# Patient Record
Sex: Female | Born: 1980 | Race: White | Hispanic: No | Marital: Single | Smoking: Never smoker
Health system: Southern US, Community
[De-identification: ages and names within clinical notes are randomized; demographics above are authoritative.]

---

## 2007-12-22 ENCOUNTER — Emergency Department (HOSPITAL_COMMUNITY): Admission: EM | Admit: 2007-12-22 | Discharge: 2007-12-22 | Payer: Self-pay | Admitting: Emergency Medicine

## 2008-10-09 ENCOUNTER — Other Ambulatory Visit: Admission: RE | Admit: 2008-10-09 | Discharge: 2008-10-09 | Payer: Self-pay | Admitting: Family Medicine

## 2011-06-13 ENCOUNTER — Other Ambulatory Visit (HOSPITAL_COMMUNITY)
Admission: RE | Admit: 2011-06-13 | Discharge: 2011-06-13 | Disposition: A | Discharge status: Home / Self Care | Payer: BC Managed Care – PPO | Source: Ambulatory Visit | Attending: Family Medicine | Admitting: Family Medicine

## 2011-06-13 DIAGNOSIS — Z01419 Encounter for gynecological examination (general) (routine) without abnormal findings: Secondary | ICD-10-CM | POA: Insufficient documentation

## 2011-09-22 ENCOUNTER — Emergency Department (HOSPITAL_COMMUNITY)
Admission: EM | Admit: 2011-09-22 | Discharge: 2011-09-23 | Disposition: A | Discharge status: Home / Self Care | Payer: PRIVATE HEALTH INSURANCE | Attending: Emergency Medicine | Admitting: Emergency Medicine

## 2011-09-22 ENCOUNTER — Encounter (HOSPITAL_COMMUNITY): Payer: Self-pay | Admitting: Emergency Medicine

## 2011-09-22 DIAGNOSIS — H53149 Visual discomfort, unspecified: Secondary | ICD-10-CM | POA: Insufficient documentation

## 2011-09-22 DIAGNOSIS — Y9210 Unspecified residential institution as the place of occurrence of the external cause: Secondary | ICD-10-CM | POA: Insufficient documentation

## 2011-09-22 DIAGNOSIS — S0003XA Contusion of scalp, initial encounter: Secondary | ICD-10-CM | POA: Insufficient documentation

## 2011-09-22 DIAGNOSIS — S0990XA Unspecified injury of head, initial encounter: Secondary | ICD-10-CM

## 2011-09-22 DIAGNOSIS — S0083XA Contusion of other part of head, initial encounter: Secondary | ICD-10-CM | POA: Insufficient documentation

## 2011-09-22 DIAGNOSIS — W2209XA Striking against other stationary object, initial encounter: Secondary | ICD-10-CM | POA: Insufficient documentation

## 2011-09-22 NOTE — ED Notes (Signed)
PT. 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 NO BLEEDING NOTED.

## 2011-09-23 MED ORDER — IBUPROFEN 600 MG PO TABS: 600.0000 mg | ORAL_TABLET | Freq: Three times a day (TID) | ORAL | Status: AC | PRN

## 2011-09-23 MED ORDER — ONDANSETRON 4 MG PO TBDP
8.0000 mg | ORAL_TABLET | Freq: Once | ORAL | Status: AC
  Administered 2011-09-23: 8 mg via ORAL
  Filled 2011-09-23: qty 2

## 2011-09-23 MED ORDER — ONDANSETRON 8 MG PO TBDP: 8.0000 mg | ORAL_TABLET | Freq: Three times a day (TID) | ORAL | Status: AC | PRN

## 2011-09-23 MED ORDER — IBUPROFEN 200 MG PO TABS
600.0000 mg | ORAL_TABLET | Freq: Once | ORAL | Status: AC
  Administered 2011-09-23: 600 mg via ORAL
  Filled 2011-09-23: qty 3

## 2011-09-23 NOTE — ED Notes (Signed)
Rx given x2 D/c instructions reviewed w/ pt - pt denies any further questions or concerns at present.   

## 2011-09-23 NOTE — ED Provider Notes (Signed)
History     CSN: [id number]  Arrival date & time 09/22/11  4098   First MD Initiated Contact with Patient 09/23/11 0019      Chief Complaint  Patient presents with  . 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 She had no loss of consciousness, but had tenderness to the scalp at the vertex of the skull were contusion is seen, and has had a dull generalized moderate intensity headache subsequently with occasional nausea and lightheadedness but no dizziness, vertigo, or vomiting. She denies any neck or back pain or focal numbness or weakness. She is awake, alert, and oriented appropriately on evaluation in no significant distress.  Patient is a 31 y.o. female presenting with head injury. The history is provided by the patient.  Head Injury  The incident occurred 6 to 12 hours ago. She came to the ER via walk-in. The injury mechanism was a direct blow. There was no loss of consciousness. There was no blood loss. The quality of the pain is described as dull. The pain is moderate. The pain has been constant (Gradually improving) since the injury. Pertinent negatives include no numbness, no blurred vision, no vomiting, no tinnitus, no disorientation, no weakness and no memory loss. She has tried nothing for the symptoms.    History reviewed. No pertinent past medical history.  History reviewed. No pertinent past surgical history.  No family history on file.  History  Substance Use Topics  . Smoking status: Never Smoker   . Smokeless tobacco: Not on file  . Alcohol Use: Yes    OB History    Grav Para Term Preterm Abortions TAB SAB Ect Mult Living                  Review of Systems  Constitutional: Negative for fever, chills, diaphoresis and appetite change.  HENT: Negative for hearing loss, ear pain, congestion, sore throat, facial swelling, rhinorrhea, trouble swallowing, neck pain, neck stiffness, dental problem, sinus pressure and tinnitus.   Eyes: Positive for photophobia. Negative for blurred vision, pain, discharge, redness and visual disturbance.  Respiratory: Negative.   Cardiovascular: Negative.   Gastrointestinal: Positive for nausea. Negative for vomiting.  Genitourinary: Negative.   Musculoskeletal: Negative for back pain.  Skin: Negative.   Neurological: Positive for light-headedness and headaches. Negative for dizziness, seizures, syncope, weakness and numbness.  Hematological: Does not bruise/bleed easily.  Psychiatric/Behavioral: Negative for memory loss and confusion.    Allergies  Sudafed  Home Medications   Current Outpatient Rx  Name Route Sig Dispense Refill  . ASPIRIN-ACETAMINOPHEN-CAFFEINE 250-250-65 MG PO TABS Oral Take 1 tablet by mouth every 6 (six) hours as needed. For migraines    . DROSPIRENONE-ETHINYL ESTRADIOL 3-0.02 MG PO TABS Oral Take 1 tablet by mouth daily.    . SUMATRIPTAN SUCCINATE 100 MG PO TABS Oral Take 100 mg by mouth every 2 (two) hours as needed. For migraines      BP 148/91  Pulse 80  Temp(Src) 97.7 F (36.5 C) (Oral)  Resp 18  SpO2 98%  Physical Exam  Constitutional: She is oriented to person, place, and time. She appears well-developed and well-nourished. No distress.  HENT:  Head: Normocephalic. Head is  with abrasion and with contusion. Head is without raccoon's eyes, without Battle's sign and without laceration.    Right Ear: Hearing, tympanic membrane, external ear and ear canal normal.  Left Ear: Hearing, tympanic membrane, external ear and ear canal normal.  Nose: Nose normal. No rhinorrhea.  Mouth/Throat: Uvula is midline, oropharynx is clear and moist and  mucous membranes are normal. No oropharyngeal exudate.  Eyes: Conjunctivae and EOM are normal. Pupils are equal, round, and reactive to light. Right eye exhibits no nystagmus. Left eye exhibits no nystagmus.  Fundoscopic exam:      The right eye shows no papilledema.       The left eye shows no papilledema.  Neck: Normal range of motion, full passive range of motion without pain and phonation normal. Neck supple. Carotid bruit is not present.  Cardiovascular: Normal rate, regular rhythm, normal heart sounds and intact distal pulses.  Exam reveals no gallop and no friction rub.   No murmur heard. Pulmonary/Chest: Effort normal and breath sounds normal. No respiratory distress. She has no wheezes. She has no rales.  Abdominal: Soft. Bowel sounds are normal. She exhibits no distension. There is no tenderness. There is no rebound and no guarding.  Musculoskeletal: Normal range of motion. She exhibits no edema and no tenderness.  Neurological: She is alert and oriented to person, place, and time. She has normal reflexes. She displays no tremor. No cranial nerve deficit or sensory deficit. She exhibits normal muscle tone. She displays a negative Romberg sign. Coordination and gait normal. GCS eye subscore is 4. GCS verbal subscore is 5. GCS motor subscore is 6.  Skin: Skin is warm and dry. No rash noted. She is not diaphoretic.  Psychiatric: She has a normal mood and affect. Her behavior is normal. Judgment and thought content normal.    ED Course  Procedures (including critical care time)  Labs Reviewed - No data to display No results found.   No diagnosis found.    MDM  The patient appears to have had a minor head injury with contusion and abrasion to the vertex of the scalp without laceration or suggestion of skull fracture. The patient's history and physical exam do not suggest a serious intracranial injury, and I suspect she has had a mild concussion with minor head injury. No further  tests are warranted. I will treat the patient symptomatically with NSAIDs and anti-emetics and discharge her home. The patient states her understanding of and agreement with the plan of care.        Felisa Bonier, MD 09/23/11 [phone]

## 2011-09-23 NOTE — Discharge Instructions (Signed)
Head Injury, Adult You have had a head injury that does not appear serious at this time. A concussion is a state of changed mental ability, usually from a blow to the head. You should take clear liquids for the rest of the day and then resume your regular diet. You should not take sedatives or alcoholic beverages for as long as directed by your caregiver after discharge. After injuries such as yours, most problems occur within the first 24 hours. SYMPTOMS These minor symptoms may be experienced after discharge:  Memory difficulties.   Dizziness.   Headaches.   Double vision.   Hearing difficulties.   Depression.   Tiredness.   Weakness.   Difficulty with concentration.  If you experience any of these problems, you should not be alarmed. A concussion requires a few days for recovery. Many patients with head injuries frequently experience such symptoms. Usually, these problems disappear without medical care. If symptoms last for more than one day, notify your caregiver. See your caregiver sooner if symptoms are becoming worse rather than better. HOME CARE INSTRUCTIONS   During the next 24 hours you must stay with someone who can watch you for the warning signs listed below.  Although it is unlikely that serious side effects will occur, you should be aware of signs and symptoms which may necessitate your return to this location. Side effects may occur up to 7 - 10 days following the injury. It is important for you to carefully monitor your condition and contact your caregiver or seek immediate medical attention if there is a change in your condition. SEEK IMMEDIATE MEDICAL CARE IF:   There is confusion or drowsiness.   You can not awaken the injured person.   There is nausea (feeling sick to your stomach) or continued, forceful vomiting.   You notice dizziness or unsteadiness which is getting worse, or inability to walk.   You have convulsions or unconsciousness.   You experience  severe, persistent headaches not relieved by over-the-counter or prescription medicines for pain. (Do not take aspirin as this impairs clotting abilities). Take other pain medications only as directed.   You can not use arms or legs normally.   There is clear or bloody discharge from the nose or ears.  MAKE SURE YOU:   Understand these instructions.   Will watch your condition.   Will get help right away if you are not doing well or get worse.  Document Released: 06/20/2005 Document Revised: 06/09/2011 Document Reviewed: 05/08/2009 [facility] Patient Information 2012 [place], [place].Cryotherapy Cryotherapy means treatment with cold. Ice or gel packs can be used to reduce both pain and swelling. Ice is the most helpful within the first 24 to 48 hours after an injury or flareup from overusing a muscle or joint. Sprains, strains, spasms, burning pain, shooting pain, and aches can all be eased with ice. Ice can also be used when recovering from surgery. Ice is effective, has very few side effects, and is safe for most people to use. PRECAUTIONS  Ice is not a safe treatment option for people with:  Raynaud's phenomenon. This is a condition affecting small blood vessels in the extremities. Exposure to cold may cause your problems to return.   Cold hypersensitivity. There are many forms of cold hypersensitivity, including:   Cold urticaria. Red, itchy hives appear on the skin when the tissues begin to warm after being iced.   Cold erythema. This is a red, itchy rash caused by exposure to cold.   Cold hemoglobinuria.  Red blood cells break down when the tissues begin to warm after being iced. The hemoglobin that carry oxygen are passed into the urine because they cannot combine with blood proteins fast enough.   Numbness or altered sensitivity in the area being iced.  If you have any of the following conditions, do not use ice until you have discussed cryotherapy with your caregiver:  Heart  conditions, such as arrhythmia, angina, or chronic heart disease.   High blood pressure.   Healing wounds or open skin in the area being iced.   Current infections.   Rheumatoid arthritis.   Poor circulation.   Diabetes.  Ice slows the blood flow in the region it is applied. This is beneficial when trying to stop inflamed tissues from spreading irritating chemicals to surrounding tissues. However, if you expose your skin to cold temperatures for too long or without the proper protection, you can damage your skin or nerves. Watch for signs of skin damage due to cold. HOME CARE INSTRUCTIONS Follow these tips to use ice and cold packs safely.  Place a dry or damp towel between the ice and skin. A damp towel will cool the skin more quickly, so you may need to shorten the time that the ice is used.   For a more rapid response, add gentle compression to the ice.   Ice for no more than 10 to 20 minutes at a time. The bonier the area you are icing, the less time it will take to get the benefits of ice.   Check your skin after 5 minutes to make sure there are no signs of a poor response to cold or skin damage.   Rest 20 minutes or more in between uses.   Once your skin is numb, you can end your treatment. You can test numbness by very lightly touching your skin. The touch should be so light that you do not see the skin dimple from the pressure of your fingertip. When using ice, most people will feel these normal sensations in this order: cold, burning, aching, and numbness.   Do not use ice on someone who cannot communicate their responses to pain, such as small children or people with dementia.  HOW TO MAKE AN ICE PACK Ice packs are the most common way to use ice therapy. Other methods include ice massage, ice baths, and cryo-sprays. Muscle creams that cause a cold, tingly feeling do not offer the same benefits that ice offers and should not be used as a substitute unless recommended by your  caregiver. To make an ice pack, do one of the following:  Place crushed ice or a bag of frozen vegetables in a sealable plastic bag. Squeeze out the excess air. Place this bag inside another plastic bag. Slide the bag into a pillowcase or place a damp towel between your skin and the bag.   Mix 3 parts water with 1 part rubbing alcohol. Freeze the mixture in a sealable plastic bag. When you remove the mixture from the freezer, it will be slushy. Squeeze out the excess air. Place this bag inside another plastic bag. Slide the bag into a pillowcase or place a damp towel between your skin and the bag.  SEEK MEDICAL CARE IF:  You develop white spots on your skin. This may give the skin a blotchy (mottled) appearance.   Your skin turns blue or pale.   Your skin becomes waxy or hard.   Your swelling gets worse.  MAKE SURE YOU:  Understand these instructions.   Will watch your condition.   Will get help right away if you are not doing well or get worse.  Document Released: 02/14/2011 Document Revised: 06/09/2011 Document Reviewed: 02/14/2011 [facility] Patient Information 2012 [place], [place].Facial and Scalp Contusions You have a contusion (bruise) on your face or scalp. Injuries around the face and head generally cause a lot of swelling, especially around the eyes. This is because the blood supply to this area is good and tissues are loose. Swelling from a contusion is usually better in 2-3 days. It may take a week or longer for a "black eye" to clear up completely. HOME CARE INSTRUCTIONS   Apply ice packs to the injured area for about 15 to 20 minutes, 3 to 4 times a day, for the first couple days. This helps keep swelling down.   Use mild pain medicine as needed or instructed by your caregiver.   You may have a mild headache, slight dizziness, nausea, and weakness for a few days. This usually clears up with bed rest and mild pain medications.   Contact your caregiver if you are concerned  about facial defects or have any difficulty with your bite or develop pain with chewing.  SEEK IMMEDIATE MEDICAL CARE IF:  You develop severe pain or a headache, unrelieved by medication.   You develop unusual sleepiness, confusion, personality changes, or vomiting.   You have a persistent nosebleed, double or blurred vision, or drainage from the nose or ear.   You have difficulty walking or using your arms or legs.  MAKE SURE YOU:   Understand these instructions.   Will watch your condition.   Will get help right away if you are not doing well or get worse.  Document Released: 07/28/2004 Document Revised: 06/09/2011 Document Reviewed: 05/06/2011 [facility] Patient Information 2012 [place], [place].Scalp Hematoma A bruise of the scalp causes swelling from an accumulation of blood in the area of the injury. This is a common problem. This problem is also called a scalp hematoma. This normally is not serious. Usually a scalp hematoma causes only mild local pain or headache. It usually disappears after 2 to 3 days with proper treatment.  You should apply ice packs to the swollen area for 20 to 30 minutes every 2 to 3 hours until the swelling improves. Only take over-the-counter or prescription medicines for pain and discomfort as directed by your caregiver. It is best to avoid aspirin because this may increase bleeding. You may have a mild headache, slight dizziness, nausea, or weakness for the next few days. This usually clears up with rest.  SEEK IMMEDIATE MEDICAL CARE IF:  You develop severe pain or headache, unrelieved by medicine.   You develop unusual sleepiness, confusion, personality changes, or difficulty with coordination or walking.   You develop a persistent nose bleed, double or blurred vision, or unusual drainage from the nose or ear.   You develop numbness or weakness in the limbs.  Document Released: 07/28/2004 Document Revised: 06/09/2011 Document Reviewed:  05/05/2009 [facility] Patient Information 2012 [place], [place].

## 2011-09-23 NOTE — ED Notes (Signed)
Pt hit her head on a on a metal object tonight while working on [employer]00.  Denies loc, nausea, but states dizziness on ambulation and photophobia.  Needs paperwork filled out by MD.

## 2012-04-09 ENCOUNTER — Other Ambulatory Visit: Payer: Self-pay | Admitting: Family Medicine

## 2012-04-09 ENCOUNTER — Ambulatory Visit
Admission: RE | Admit: 2012-04-09 | Discharge: 2012-04-09 | Disposition: A | Discharge status: Home / Self Care | Payer: Worker's Compensation | Source: Ambulatory Visit | Attending: Family Medicine | Admitting: Family Medicine

## 2012-04-09 DIAGNOSIS — W19XXXA Unspecified fall, initial encounter: Secondary | ICD-10-CM

## 2012-07-10 ENCOUNTER — Other Ambulatory Visit (HOSPITAL_COMMUNITY)
Admission: RE | Admit: 2012-07-10 | Discharge: 2012-07-10 | Disposition: A | Discharge status: Home / Self Care | Payer: BC Managed Care – PPO | Source: Ambulatory Visit | Attending: Family Medicine | Admitting: Family Medicine

## 2012-07-10 DIAGNOSIS — Z01419 Encounter for gynecological examination (general) (routine) without abnormal findings: Secondary | ICD-10-CM | POA: Insufficient documentation

## 2012-07-11 ENCOUNTER — Other Ambulatory Visit: Payer: Self-pay | Admitting: Physician Assistant

## 2013-09-05 ENCOUNTER — Other Ambulatory Visit: Payer: Self-pay | Admitting: Physician Assistant

## 2013-09-05 ENCOUNTER — Other Ambulatory Visit (HOSPITAL_COMMUNITY)
Admission: RE | Admit: 2013-09-05 | Discharge: 2013-09-05 | Disposition: A | Discharge status: Home / Self Care | Payer: No Typology Code available for payment source | Source: Ambulatory Visit | Attending: Family Medicine | Admitting: Family Medicine

## 2013-09-05 DIAGNOSIS — R1013 Epigastric pain: Secondary | ICD-10-CM

## 2013-09-05 DIAGNOSIS — Z01419 Encounter for gynecological examination (general) (routine) without abnormal findings: Secondary | ICD-10-CM | POA: Insufficient documentation

## 2013-09-13 ENCOUNTER — Ambulatory Visit
Admission: RE | Admit: 2013-09-13 | Discharge: 2013-09-13 | Disposition: A | Discharge status: Home / Self Care | Payer: No Typology Code available for payment source | Source: Ambulatory Visit | Attending: Physician Assistant | Admitting: Physician Assistant

## 2013-09-13 DIAGNOSIS — R1013 Epigastric pain: Secondary | ICD-10-CM

## 2015-01-06 IMAGING — US US ABDOMEN LIMITED
1 series · 14 of 25 positions shown · non-contrast
Comparison: None.

CLINICAL DATA: Epigastric abdominal pain, nausea, vomiting

EXAM:
US ABDOMEN LIMITED - RIGHT UPPER QUADRANT

[Series 1: us abdomen limited · 0.30mm/px · 14 of 49 slices shown]
[im 1/49]
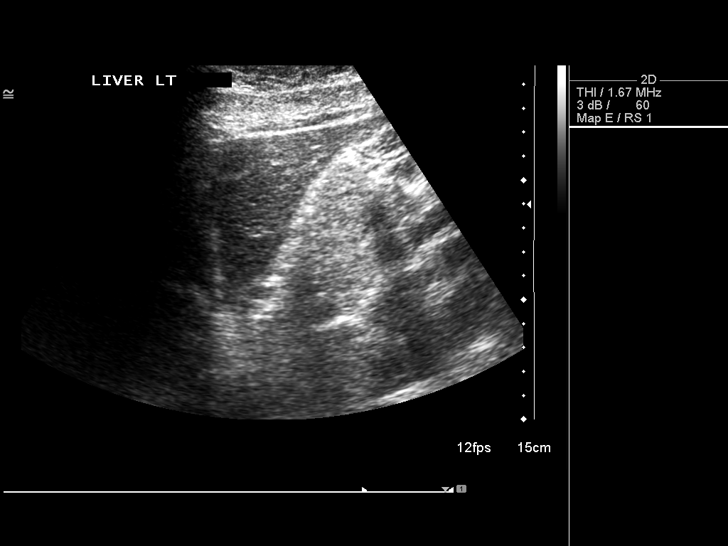
[im 5/49]
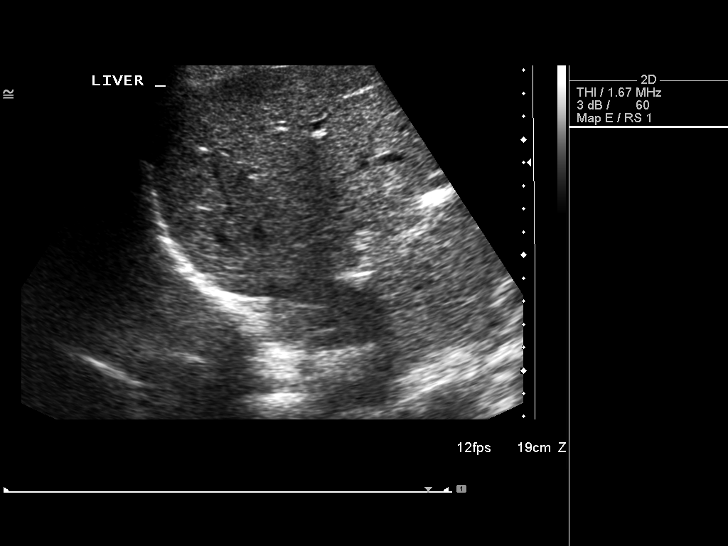
[im 9/49]
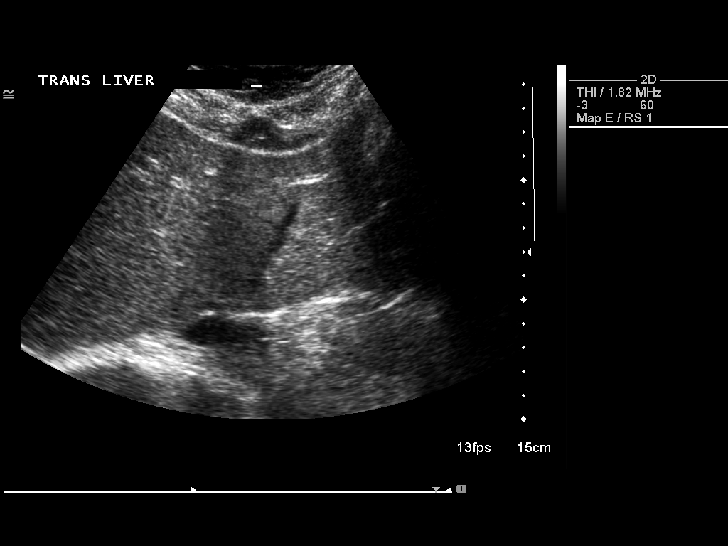
[im 13/49]
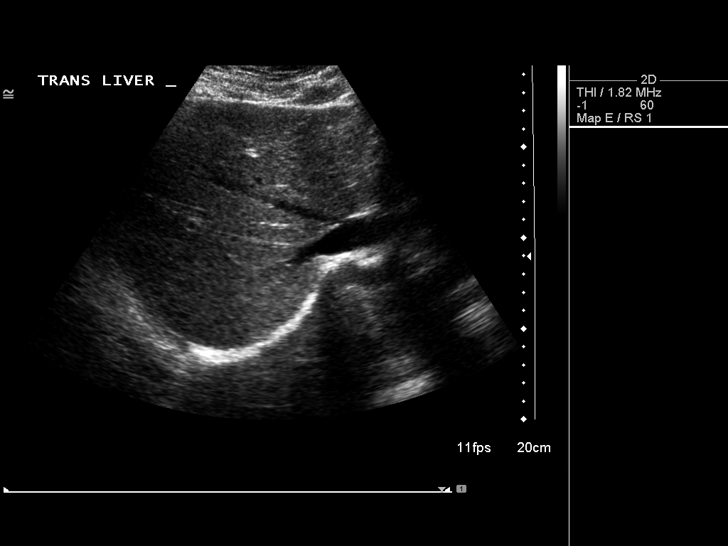
[im 17/49]
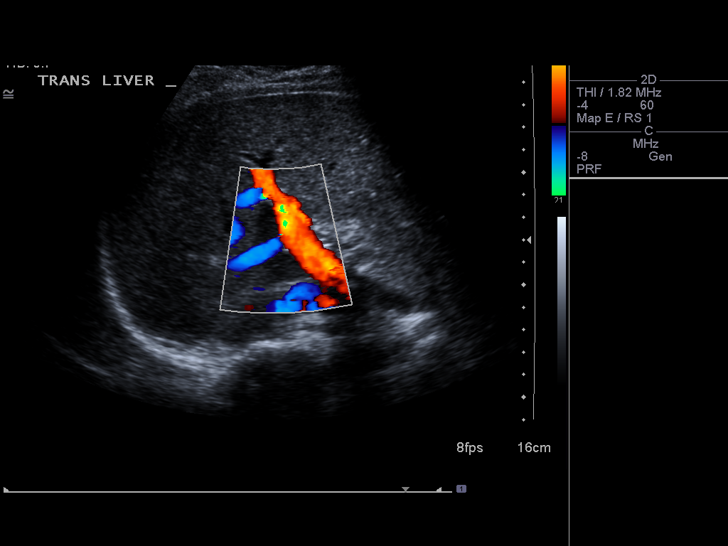
[im 19/49]
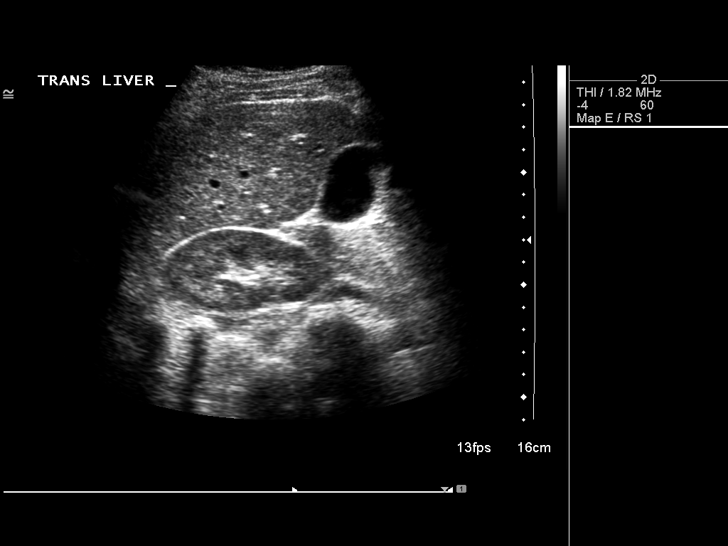
[im 23/49]
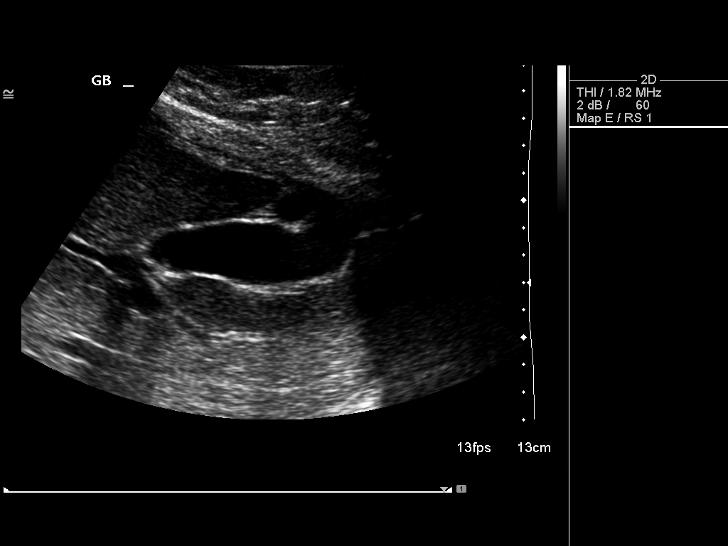
[im 27/49]
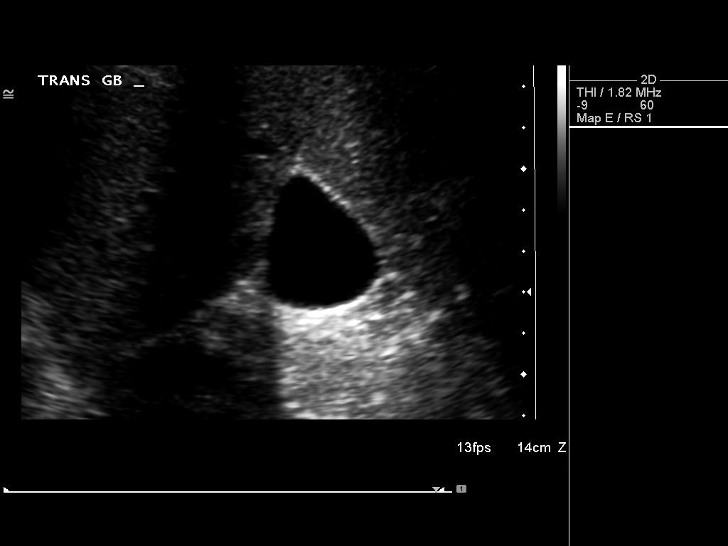
[im 31/49]
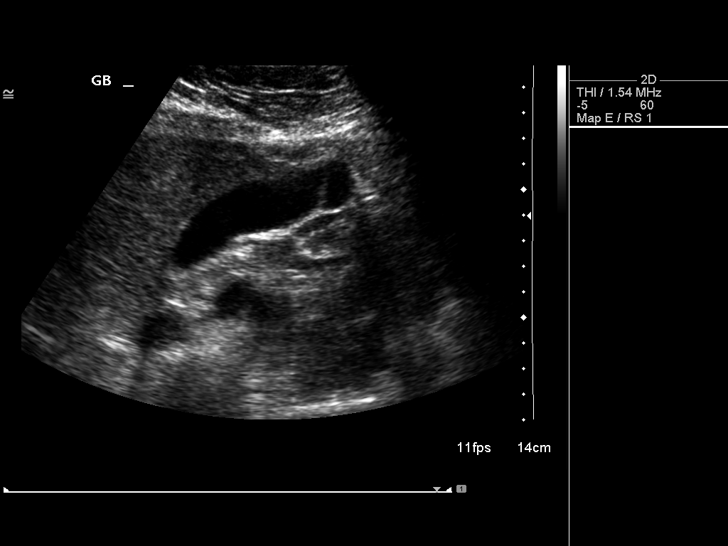
[im 33/49]
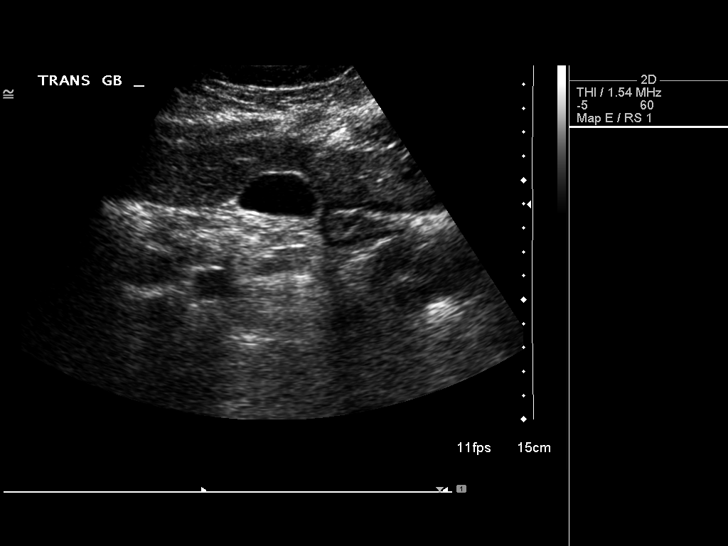
[im 37/49]
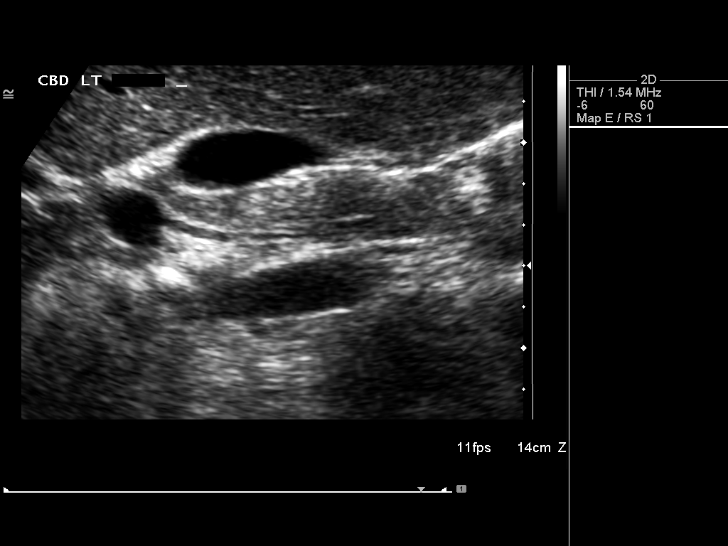
[im 41/49]
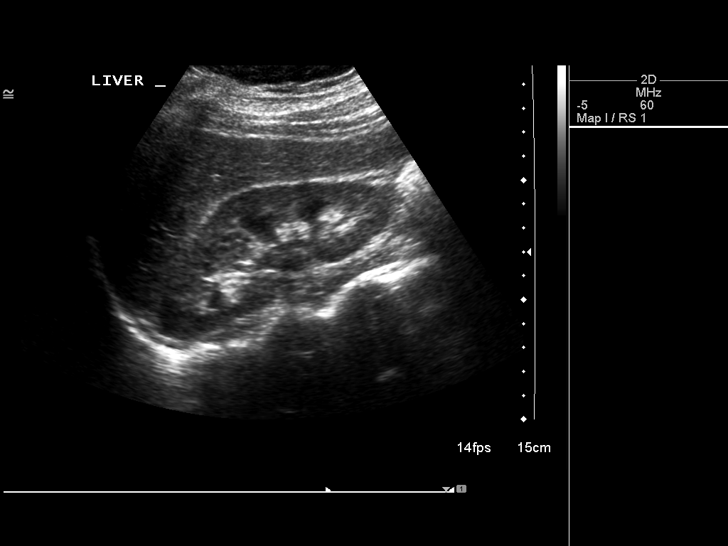
[im 45/49]
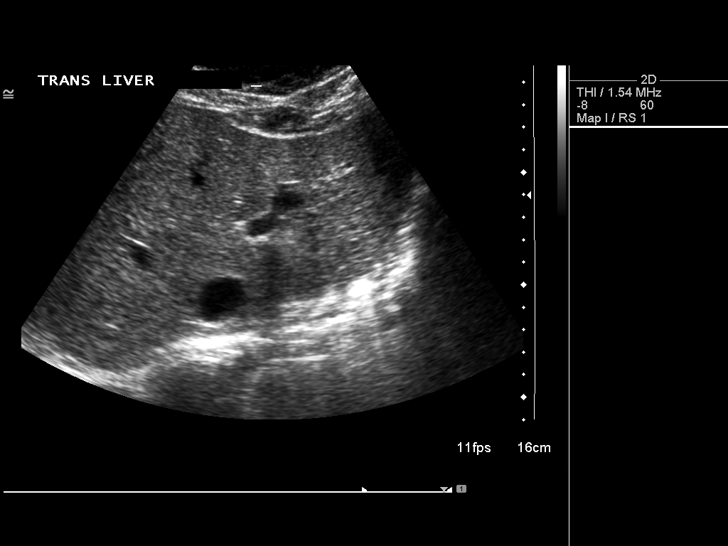
[im 49/49]
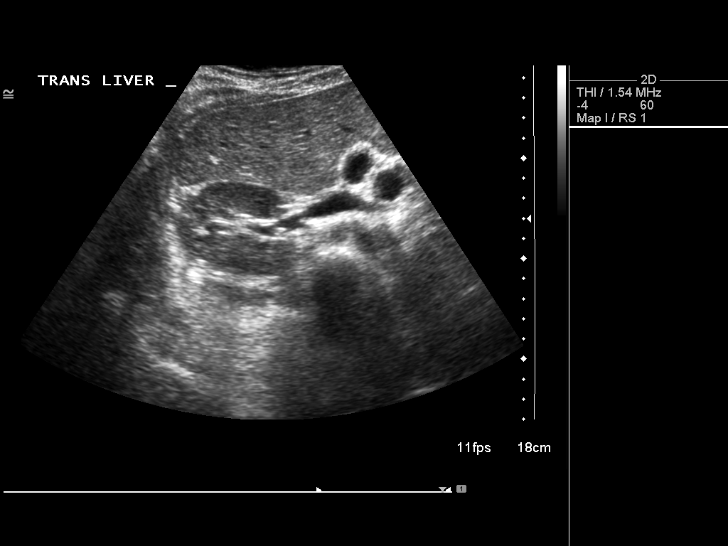

[14 of 25 positions shown; findings below may reference images not displayed]

FINDINGS: Gallbladder:

No gallstones or wall thickening visualized. No sonographic Murphy
sign noted.

Common bile duct:

Diameter: 2.8 mm

Liver:

No focal lesion identified. Within normal limits in parenchymal
echogenicity.
IMPRESSION: No acute finding.  Normal exam.

## 2015-10-01 ENCOUNTER — Other Ambulatory Visit (HOSPITAL_COMMUNITY)
Admission: RE | Admit: 2015-10-01 | Discharge: 2015-10-01 | Disposition: A | Discharge status: Home / Self Care | Payer: Managed Care, Other (non HMO) | Source: Ambulatory Visit | Attending: Family Medicine | Admitting: Family Medicine

## 2015-10-01 ENCOUNTER — Other Ambulatory Visit: Payer: Self-pay | Admitting: Physician Assistant

## 2015-10-01 DIAGNOSIS — Z124 Encounter for screening for malignant neoplasm of cervix: Secondary | ICD-10-CM | POA: Diagnosis not present

## 2015-10-02 LAB — CYTOLOGY - PAP
